# Patient Record
Sex: Female | Born: 1990 | Race: White | Hispanic: Yes | Marital: Single | Smoking: Never smoker
Health system: Southern US, Community
[De-identification: ages and names within clinical notes are randomized; demographics above are authoritative.]

## PROBLEM LIST (undated history)

## (undated) DIAGNOSIS — Z789 Other specified health status: Secondary | ICD-10-CM

## (undated) DIAGNOSIS — D649 Anemia, unspecified: Secondary | ICD-10-CM

## (undated) DIAGNOSIS — N390 Urinary tract infection, site not specified: Secondary | ICD-10-CM

## (undated) HISTORY — PX: OTHER SURGICAL HISTORY: SHX169

## (undated) HISTORY — DX: Other specified health status: Z78.9

## (undated) HISTORY — DX: Urinary tract infection, site not specified: N39.0

## (undated) HISTORY — DX: Anemia, unspecified: D64.9

---

## 2020-05-24 ENCOUNTER — Other Ambulatory Visit: Payer: Self-pay

## 2020-05-24 ENCOUNTER — Ambulatory Visit (LOCAL_COMMUNITY_HEALTH_CENTER): Payer: Self-pay

## 2020-05-24 VITALS — BP 89/64 | Ht 59.06 in | Wt 112.0 lb

## 2020-05-24 DIAGNOSIS — Z3201 Encounter for pregnancy test, result positive: Secondary | ICD-10-CM

## 2020-05-24 LAB — PREGNANCY, URINE: Preg Test, Ur: POSITIVE — AB

## 2020-05-24 MED ORDER — PRENATAL VITAMIN 27-0.8 MG PO TABS: 1.0000 | ORAL_TABLET | Freq: Every day | ORAL | 0 refills | Status: AC

## 2020-05-24 NOTE — Progress Notes (Signed)
No [facility]. Pt desires prenatal care at [facility]; sent to preadmit.

## 2020-06-06 ENCOUNTER — Telehealth: Payer: Self-pay

## 2020-06-06 NOTE — Telephone Encounter (Signed)
LM for patient to arrive at 1:00 tomorrow for new OB appointment. Interpreter, Juliene Pina.Marland KitchenMarland KitchenBurt Knack, RN

## 2020-07-08 ENCOUNTER — Telehealth: Payer: Self-pay

## 2020-07-08 NOTE — Telephone Encounter (Signed)
TC to patient to follow-up on missed new OB appointment. Phone belongs to patient spouse who states they are receiving maternity care at [facility]. Interpreter ID# [id number], Reuel Boom.Burt Knack, RN

## 2020-07-12 ENCOUNTER — Other Ambulatory Visit: Payer: Self-pay | Admitting: Physician Assistant

## 2020-07-12 DIAGNOSIS — Z3482 Encounter for supervision of other normal pregnancy, second trimester: Secondary | ICD-10-CM

## 2020-07-15 ENCOUNTER — Emergency Department
Admission: EM | Admit: 2020-07-15 | Discharge: 2020-07-15 | Disposition: A | Discharge status: Home / Self Care | Payer: Self-pay | Attending: Emergency Medicine | Admitting: Emergency Medicine

## 2020-07-15 ENCOUNTER — Emergency Department: Payer: Self-pay

## 2020-07-15 ENCOUNTER — Other Ambulatory Visit: Payer: Self-pay

## 2020-07-15 ENCOUNTER — Encounter: Payer: Self-pay | Admitting: Emergency Medicine

## 2020-07-15 DIAGNOSIS — Z3A19 19 weeks gestation of pregnancy: Secondary | ICD-10-CM | POA: Insufficient documentation

## 2020-07-15 DIAGNOSIS — M545 Low back pain, unspecified: Secondary | ICD-10-CM | POA: Insufficient documentation

## 2020-07-15 DIAGNOSIS — O26892 Other specified pregnancy related conditions, second trimester: Secondary | ICD-10-CM | POA: Insufficient documentation

## 2020-07-15 DIAGNOSIS — Z3492 Encounter for supervision of normal pregnancy, unspecified, second trimester: Secondary | ICD-10-CM

## 2020-07-15 DIAGNOSIS — R1031 Right lower quadrant pain: Secondary | ICD-10-CM | POA: Insufficient documentation

## 2020-07-15 DIAGNOSIS — R110 Nausea: Secondary | ICD-10-CM | POA: Insufficient documentation

## 2020-07-15 LAB — CBC
HCT: 34 % — ABNORMAL LOW (ref 36.0–46.0)
Hemoglobin: 11.9 g/dL — ABNORMAL LOW (ref 12.0–15.0)
MCH: 31.4 pg (ref 26.0–34.0)
MCHC: 35 g/dL (ref 30.0–36.0)
MCV: 89.7 fL (ref 80.0–100.0)
Platelets: 315 10*3/uL (ref 150–400)
RBC: 3.79 MIL/uL — ABNORMAL LOW (ref 3.87–5.11)
RDW: 13.8 % (ref 11.5–15.5)
WBC: 9 10*3/uL (ref 4.0–10.5)
nRBC: 0 % (ref 0.0–0.2)

## 2020-07-15 LAB — COMPREHENSIVE METABOLIC PANEL
ALT: 10 U/L (ref 0–44)
AST: 20 U/L (ref 15–41)
Albumin: 3.4 g/dL — ABNORMAL LOW (ref 3.5–5.0)
Alkaline Phosphatase: 60 U/L (ref 38–126)
Anion gap: 9 (ref 5–15)
BUN: 11 mg/dL (ref 6–20)
CO2: 23 mmol/L (ref 22–32)
Calcium: 9 mg/dL (ref 8.9–10.3)
Chloride: 104 mmol/L (ref 98–111)
Creatinine, Ser: 0.5 mg/dL (ref 0.44–1.00)
GFR, Estimated: >60 mL/min (ref >60)
Glucose, Bld: 78 mg/dL (ref 70–99)
Potassium: 4.7 mmol/L (ref 3.5–5.1)
Sodium: 136 mmol/L (ref 135–145)
Total Bilirubin: 0.5 mg/dL (ref 0.3–1.2)
Total Protein: 7.2 g/dL (ref 6.5–8.1)

## 2020-07-15 LAB — URINALYSIS, COMPLETE (UACMP) WITH MICROSCOPIC
Bilirubin Urine: NEGATIVE
Glucose, UA: NEGATIVE mg/dL
Hgb urine dipstick: NEGATIVE
Ketones, ur: 5 mg/dL — AB
Leukocytes,Ua: NEGATIVE
Nitrite: NEGATIVE
Protein, ur: NEGATIVE mg/dL
Specific Gravity, Urine: 1.008 (ref 1.005–1.030)
pH: 6 (ref 5.0–8.0)

## 2020-07-15 LAB — LIPASE, BLOOD: Lipase: 36 U/L (ref 11–51)

## 2020-07-15 MED ORDER — MORPHINE SULFATE (PF) 2 MG/ML IV SOLN
2.0000 mg | Freq: Once | INTRAVENOUS | Status: AC
  Administered 2020-07-15: 2 mg via INTRAVENOUS
  Filled 2020-07-15: qty 1

## 2020-07-15 MED ORDER — SODIUM CHLORIDE 0.9 % IV BOLUS
1000.0000 mL | Freq: Once | INTRAVENOUS | Status: AC
  Administered 2020-07-15: 1000 mL via INTRAVENOUS

## 2020-07-15 MED ORDER — ONDANSETRON HCL 4 MG/2ML IJ SOLN
4.0000 mg | Freq: Once | INTRAMUSCULAR | Status: AC
  Administered 2020-07-15: 4 mg via INTRAVENOUS
  Filled 2020-07-15: qty 2

## 2020-07-15 NOTE — Discharge Instructions (Signed)
Your ultrasound of the kidneys and pregnancy were okay, and an MRI of your abdomen and pelvis was also normal.  We did not find any specific cause for your symptoms, but your evaluation is reassuring.  Take Tylenol as needed and follow-up with your doctor this week please.

## 2020-07-15 NOTE — ED Triage Notes (Signed)
Pt c/o right sided abdominal pain and right flank pain.  Pt denies urinary symptoms.  Pt describes pain as constant cramping.  Pt denies n/v.  FHT obtained in triage 125 with fetal movement

## 2020-07-15 NOTE — ED Notes (Signed)
Rainbow sent to the lab.  

## 2020-07-15 NOTE — ED Notes (Signed)
Ultrasound at bedside at this time.

## 2020-07-15 NOTE — ED Provider Notes (Signed)
[facility] Emergency Department Provider Note  ____________________________________________  Time seen: Approximately 6:37 PM  I have reviewed the triage vital signs and the nursing notes.   HISTORY  Chief Complaint Abdominal Pain  Encounter completed with Spanish video interpreter  HPI Kelly Gutierrez is a 29 y.o. female is currently 19 weeks 1 day pregnant, [facility], who was in her usual state of health until last night when she began having right lower back pain radiating to the right lower quadrant which is intermittent, feels sharp and crampy, associated with nausea.  No vomiting or diarrhea.  Pain currently 7/10 in intensity.  No aggravating or alleviating factors.  Denies fever or chills.  No vaginal bleeding, discharge, or fluid leakage.  Fetal heart tones in triage were 125.      Past Medical History:  Diagnosis Date  . Anemia    during 1st pregnancy  . Patient denies medical problems   . UTI (urinary tract infection)    during previous pregnancy     There are no problems to display for this patient.    Past Surgical History:  Procedure Laterality Date  . denies    . denies surgical history       Prior to Admission medications   Medication Sig Start Date End Date Taking? Authorizing Provider  Prenatal Vit-Fe Fumarate-FA (PRENATAL VITAMIN) 27-0.8 MG TABS Take 1 tablet by mouth daily. 05/24/20 09/01/20 Yes Federico FlakeNewton, Kimberly Niles, MD     Allergies Patient has no known allergies.   History reviewed. No pertinent family history.  Social History Social History   Tobacco Use  . Smoking status: Never Smoker  . Smokeless tobacco: Never Used  Vaping Use  . Vaping Use: Never used  Substance Use Topics  . Alcohol use: Never  . Drug use: Never    Review of Systems  Constitutional:   No fever or chills.  ENT:   No sore throat. No rhinorrhea. Cardiovascular:   No chest pain or syncope. Respiratory:   No  dyspnea or cough. Gastrointestinal:   Positive as above for abdominal pain without vomiting or diarrhea Musculoskeletal:   Negative for focal pain or swelling All other systems reviewed and are negative except as documented above in ROS and HPI.  ____________________________________________   PHYSICAL EXAM:  VITAL SIGNS: ED Triage Vitals  Enc Vitals Group     BP 07/15/20 1355 (!) 106/58     Pulse Rate 07/15/20 1355 69     Resp 07/15/20 1355 16     Temp 07/15/20 1355 98.1 F (36.7 C)     Temp Source 07/15/20 1355 Oral     SpO2 07/15/20 1355 100 %     Weight 07/15/20 1401 116 lb (52.6 kg)     Height 07/15/20 1401 4' 11.06" (1.5 m)     Head Circumference --      Peak Flow --      Pain Score 07/15/20 1407 7     Pain Loc --      Pain Edu? --      Excl. in GC? --     Vital signs reviewed, nursing assessments reviewed.   Constitutional:   Alert and oriented. Non-toxic appearance. Eyes:   Conjunctivae are normal. EOMI. PERRL. ENT      Head:   Normocephalic and atraumatic.      Nose:   Wearing a mask.      Mouth/Throat:   Wearing a mask.      Neck:  No meningismus. Full ROM. Hematological/Lymphatic/Immunilogical:   No cervical lymphadenopathy. Cardiovascular:   RRR.  Normal radial pulse.  Cap refill less than 2 seconds. Respiratory:   Normal respiratory effort without tachypnea/retractions. Gastrointestinal:   Soft with right lower quadrant tenderness.  Gravid abdomen, size consistent with dates.  Non distended. There is no CVA tenderness.  No rebound, rigidity, or guarding.  Musculoskeletal:   Normal range of motion in all extremities. No joint effusions.  No lower extremity tenderness.  No edema. Neurologic:   Normal speech and language.  Motor grossly intact. No acute focal neurologic deficits are appreciated.  Skin:    Skin is warm, dry and intact. No rash noted.  No petechiae, purpura, or bullae.  ____________________________________________    LABS (pertinent  positives/negatives) (all labs ordered are listed, but only abnormal results are displayed) Labs Reviewed  COMPREHENSIVE METABOLIC PANEL - Abnormal; Notable for the following components:      Result Value   Albumin 3.4 (*)    All other components within normal limits  CBC - Abnormal; Notable for the following components:   RBC 3.79 (*)    Hemoglobin 11.9 (*)    HCT 34.0 (*)    All other components within normal limits  URINALYSIS, COMPLETE (UACMP) WITH MICROSCOPIC - Abnormal; Notable for the following components:   Color, Urine STRAW (*)    APPearance CLEAR (*)    Ketones, ur 5 (*)    Bacteria, UA RARE (*)    All other components within normal limits  LIPASE, BLOOD   ____________________________________________   EKG    ____________________________________________    RADIOLOGY  MR PELVIS WO CONTRAST  Result Date: 07/15/2020 CLINICAL DATA:  Right-sided abdominal pain, known pregnancy EXAM: MRI ABDOMEN AND PELVIS WITHOUT CONTRAST TECHNIQUE: Multiplanar multisequence MR imaging of the abdomen and pelvis was performed. No intravenous contrast was administered. COMPARISON:  Ultrasounds from earlier in the same day. FINDINGS: COMBINED FINDINGS FOR BOTH MR ABDOMEN AND PELVIS Lower chest: Within normal limits. Hepatobiliary: Liver and gallbladder appear within normal limits. No biliary ductal dilatation is seen. Pancreas: No mass, inflammatory changes, or other parenchymal abnormality identified. Spleen:  Within normal limits in size and appearance. Adrenals/Urinary Tract: Adrenal glands are unremarkable. Kidneys demonstrate no mass lesion or hydronephrosis. Bladder is well distended. No filling defect is identified. Stomach/Bowel: Colon shows no obstructive or inflammatory changes. Small bowel is within normal limits. Stomach is decompressed. The appendix is well visualized in the right lower quadrant adjacent to the cecum. No inflammatory changes are seen. No findings are noted to  suggest acute appendicitis. Vascular/Lymphatic:  Within normal limits. Reproductive: Gravid uterus is noted. The placenta is posterior in location. Current fetal position is breech similar to that noted on prior ultrasound. No evidence of placenta previa is seen. Adnexa appear within normal limits. Other:  None Musculoskeletal: No suspicious bone lesions identified. IMPRESSION: Normal-appearing appendix in the right lower quadrant. No findings to suggest appendicitis are noted. Single intrauterine gestation as previously described. No other focal abnormality is seen. Electronically Signed   By: Alcide Clever M.D.   On: 07/15/2020 21:37   MR ABDOMEN WO CONTRAST  Result Date: 07/15/2020 CLINICAL DATA:  Right-sided abdominal pain, known pregnancy EXAM: MRI ABDOMEN AND PELVIS WITHOUT CONTRAST TECHNIQUE: Multiplanar multisequence MR imaging of the abdomen and pelvis was performed. No intravenous contrast was administered. COMPARISON:  Ultrasounds from earlier in the same day. FINDINGS: COMBINED FINDINGS FOR BOTH MR ABDOMEN AND PELVIS Lower chest: Within normal limits. Hepatobiliary: Liver and gallbladder  appear within normal limits. No biliary ductal dilatation is seen. Pancreas: No mass, inflammatory changes, or other parenchymal abnormality identified. Spleen:  Within normal limits in size and appearance. Adrenals/Urinary Tract: Adrenal glands are unremarkable. Kidneys demonstrate no mass lesion or hydronephrosis. Bladder is well distended. No filling defect is identified. Stomach/Bowel: Colon shows no obstructive or inflammatory changes. Small bowel is within normal limits. Stomach is decompressed. The appendix is well visualized in the right lower quadrant adjacent to the cecum. No inflammatory changes are seen. No findings are noted to suggest acute appendicitis. Vascular/Lymphatic:  Within normal limits. Reproductive: Gravid uterus is noted. The placenta is posterior in location. Current fetal position is  breech similar to that noted on prior ultrasound. No evidence of placenta previa is seen. Adnexa appear within normal limits. Other:  None Musculoskeletal: No suspicious bone lesions identified. IMPRESSION: Normal-appearing appendix in the right lower quadrant. No findings to suggest appendicitis are noted. Single intrauterine gestation as previously described. No other focal abnormality is seen. Electronically Signed   By: Alcide Clever M.D.   On: 07/15/2020 21:37   US OB Limited > 14 wks  Result Date: 07/15/2020 CLINICAL DATA:  Nineteen weeks pregnant, right lower quadrant pain EXAM: LIMITED OBSTETRIC ULTRASOUND AN DOPPLER FINDINGS: Number of Fetuses: 1 Heart Rate:  147 bpm Movement: Yes Presentation: Breech Placental Location: Posterior Previa: No Amniotic Fluid (Subjective):  Within normal limits. BPD:  5.5cm 22w 4d MATERNAL FINDINGS: Cervix:  Appears closed. Uterus/Adnexae: No abnormality visualized. Right ovary measures 1.7 x 1.1 x 1.2 cm. Left ovary measures 2 x 1 x 1.6 cm. Pulsed Doppler evaluation of both ovaries demonstrates normal low-resistance arterial and venous waveforms. Other findings No free fluid. IMPRESSION: 1. Negative for ovarian torsion or ovarian mass lesion. 2. Single viable intrauterine pregnancy as above. No specific abnormality is seen. This exam is performed on an emergent basis and does not comprehensively evaluate fetal size, dating, or anatomy; follow-up complete OB US should be considered if further fetal assessment is warranted. Electronically Signed   By: Jasmine Pang M.D.   On: 07/15/2020 19:15   US Renal  Result Date: 07/15/2020 CLINICAL DATA:  Right low back pain radiating to right lower quadrant. Nineteen weeks pregnant. EXAM: RENAL / URINARY TRACT ULTRASOUND COMPLETE COMPARISON:  None. FINDINGS: Right Kidney: Renal measurements: 9.6 x 4.6 x 4.8 cm = volume: 112 mL. Echogenicity within normal limits. No mass or hydronephrosis visualized. Left Kidney: Renal  measurements: 9.2 x 5.2 x 5.0 cm = volume: 12.4 mL. Echogenicity within normal limits. No mass or hydronephrosis visualized. Bladder: Appears normal for degree of bladder distention. Other: None. IMPRESSION: No acute findings.  No hydronephrosis. Electronically Signed   By: Charlett Nose M.D.   On: 07/15/2020 19:16   US PELVIC DOPPLER (TORSION R/O OR MASS ARTERIAL FLOW)  Result Date: 07/15/2020 CLINICAL DATA:  Nineteen weeks pregnant, right lower quadrant pain EXAM: LIMITED OBSTETRIC ULTRASOUND AN DOPPLER FINDINGS: Number of Fetuses: 1 Heart Rate:  147 bpm Movement: Yes Presentation: Breech Placental Location: Posterior Previa: No Amniotic Fluid (Subjective):  Within normal limits. BPD:  5.5cm 22w 4d MATERNAL FINDINGS: Cervix:  Appears closed. Uterus/Adnexae: No abnormality visualized. Right ovary measures 1.7 x 1.1 x 1.2 cm. Left ovary measures 2 x 1 x 1.6 cm. Pulsed Doppler evaluation of both ovaries demonstrates normal low-resistance arterial and venous waveforms. Other findings No free fluid. IMPRESSION: 1. Negative for ovarian torsion or ovarian mass lesion. 2. Single viable intrauterine pregnancy as above. No specific abnormality is seen.  This exam is performed on an emergent basis and does not comprehensively evaluate fetal size, dating, or anatomy; follow-up complete OB US should be considered if further fetal assessment is warranted. Electronically Signed   By: Jasmine Pang M.D.   On: 07/15/2020 19:15    ____________________________________________   PROCEDURES Procedures  ____________________________________________  DIFFERENTIAL DIAGNOSIS   Ureterolithiasis, appendicitis, ovarian cyst, ovarian torsion  CLINICAL IMPRESSION / ASSESSMENT AND PLAN / ED COURSE  Medications ordered in the ED: Medications  sodium chloride 0.9 % bolus 1,000 mL (0 mLs Intravenous Stopped 07/15/20 2059)  ondansetron (ZOFRAN) injection 4 mg (4 mg Intravenous Given 07/15/20 1850)  morphine 2 MG/ML injection  2 mg (2 mg Intravenous Given 07/15/20 1850)    Pertinent labs & imaging results that were available during my care of the patient were reviewed by me and considered in my medical decision making (see chart for details).  Kelly Gutierrez was evaluated in Emergency Department on 07/15/2020 for the symptoms described in the history of present illness. She was evaluated in the context of the global COVID-19 pandemic, which necessitated consideration that the patient might be at risk for infection with the SARS-CoV-2 virus that causes COVID-19. Institutional protocols and algorithms that pertain to the evaluation of patients at risk for COVID-19 are in a state of rapid change based on information released by regulatory bodies including the CDC and federal and state organizations. These policies and algorithms were followed during the patient's care in the ED.   Patient presents with right lower quadrant pain.  Doubt STI PID or TOA.  Afebrile with normal vitals.  Nontoxic appearance.  We will start with ultrasound renal and OB protocols.  Proceed to MRI abdomen pelvis to evaluate for appendicitis if ultrasounds are unremarkable.  Will give initial IV fluids, Zofran 4 mg IV for nausea control and morphine 4 mg IV for pain control while proceeding with work-up.  Clinical Course as of Jul 15 2256  Mon Jul 15, 2020  1931 Ultrasound renal and pelvic both unremarkable.  Live IUP without ovarian mass or torsion, no renal abnormalities.  Awaiting MRI.   [PS]    Clinical Course User Index [PS] Sharman Cheek, MD     ----------------------------------------- 10:57 PM on 07/15/2020 -----------------------------------------  MRI is normal.  Vital signs remain normal.  Patient reports that her pain is improved and she is now nontender on repeat abdominal exam.  Stable for discharge home to follow-up with her PCP this week.  ____________________________________________   FINAL CLINICAL  IMPRESSION(S) / ED DIAGNOSES    Final diagnoses:  Right lower quadrant abdominal pain  Second trimester pregnancy     ED Discharge Orders    None      Portions of this note were generated with dragon dictation software. Dictation errors may occur despite best attempts at proofreading.   Sharman Cheek, MD 07/15/20 2258

## 2020-07-15 NOTE — ED Triage Notes (Signed)
Patient is 19 weeks 1 day.  To be seen through ED.

## 2020-07-26 ENCOUNTER — Ambulatory Visit
Admission: RE | Admit: 2020-07-26 | Discharge: 2020-07-26 | Disposition: A | Discharge status: Home / Self Care | Payer: Self-pay | Source: Ambulatory Visit | Attending: Physician Assistant | Admitting: Physician Assistant

## 2020-07-26 ENCOUNTER — Other Ambulatory Visit: Payer: Self-pay

## 2020-07-26 DIAGNOSIS — Z3482 Encounter for supervision of other normal pregnancy, second trimester: Secondary | ICD-10-CM | POA: Insufficient documentation

## 2022-01-19 IMAGING — US US RENAL
1 series · 14 of 23 positions shown · non-contrast
Comparison: None.

CLINICAL DATA: Right low back pain radiating to right lower
quadrant. Nineteen weeks pregnant.

EXAM:
RENAL / URINARY TRACT ULTRASOUND COMPLETE

[Series 1: us renal · 14 of 23 slices shown]
[im 1/23]
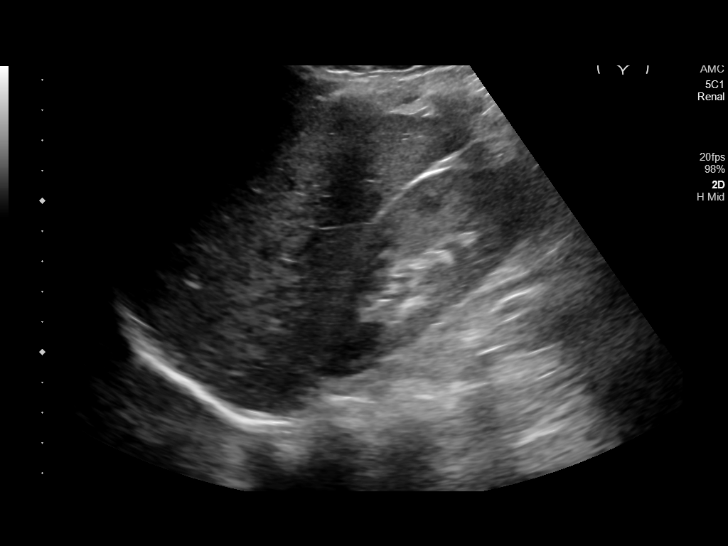
[im 3/23]
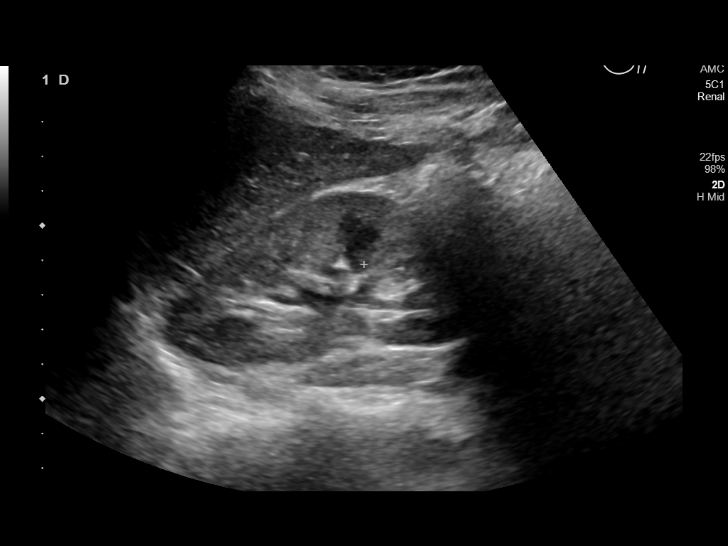
[im 5/23]
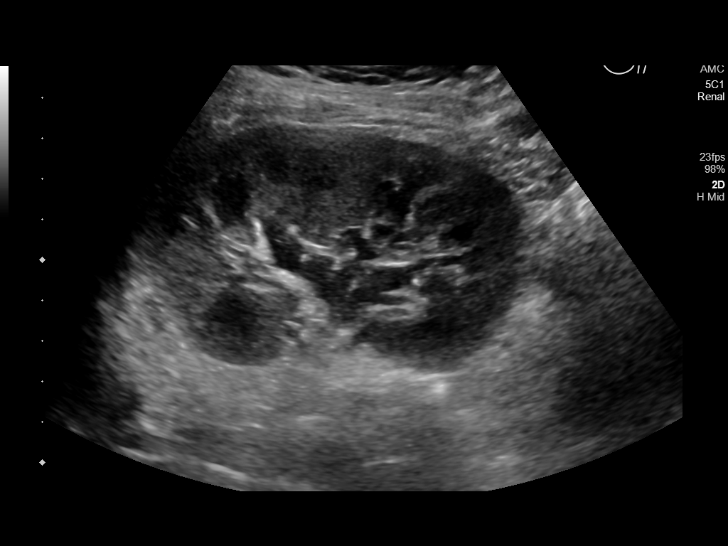
[im 6/23]
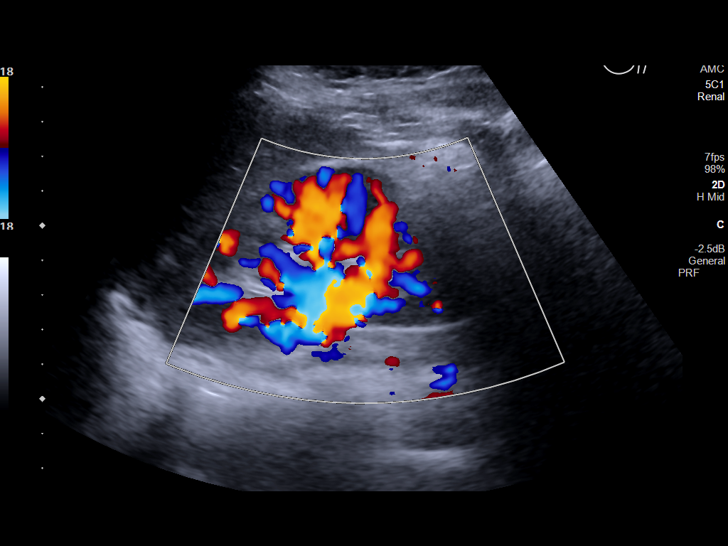
[im 8/23]
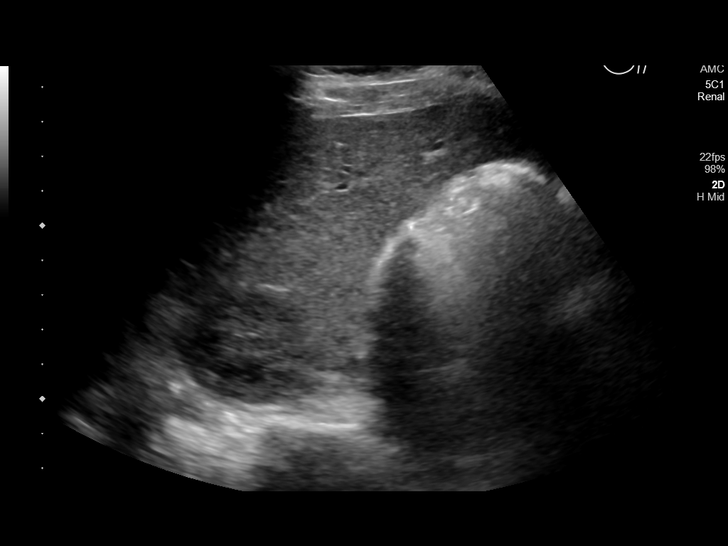
[im 10/23]
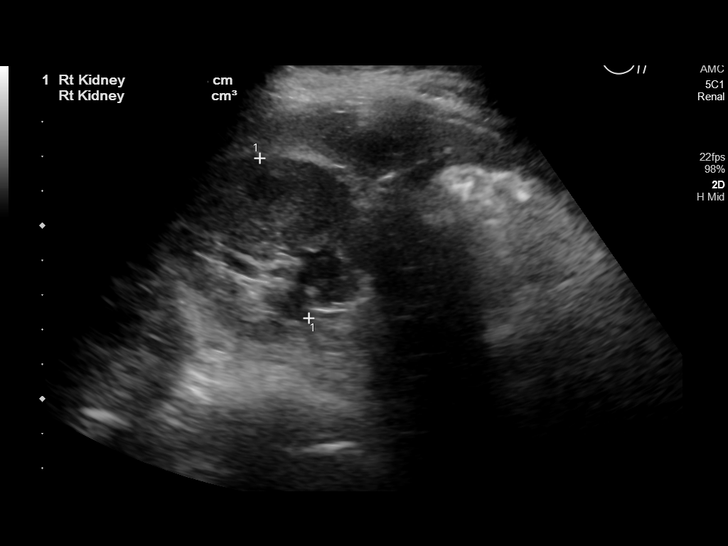
[im 11/23]
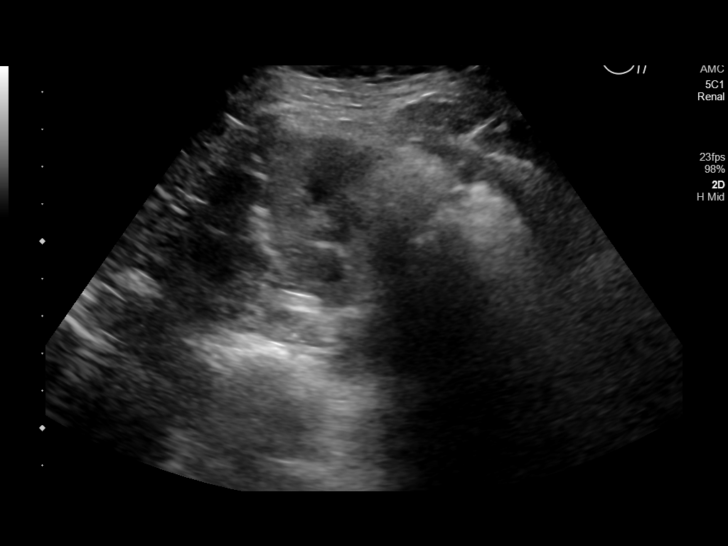
[im 13/23]
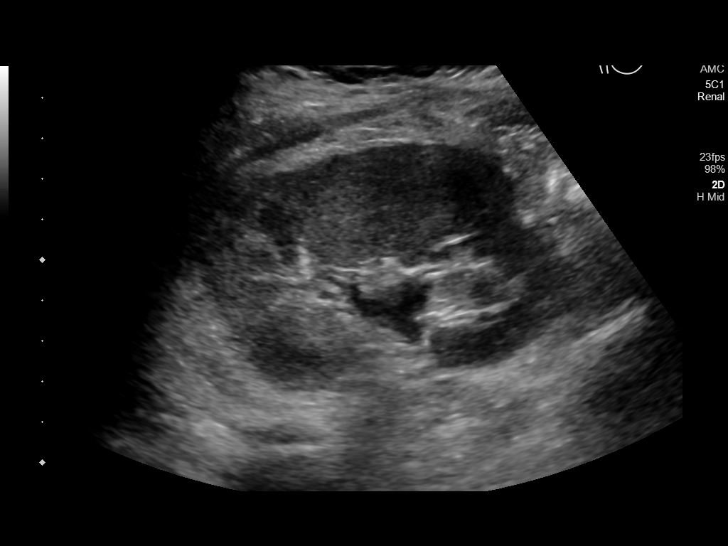
[im 14/23]
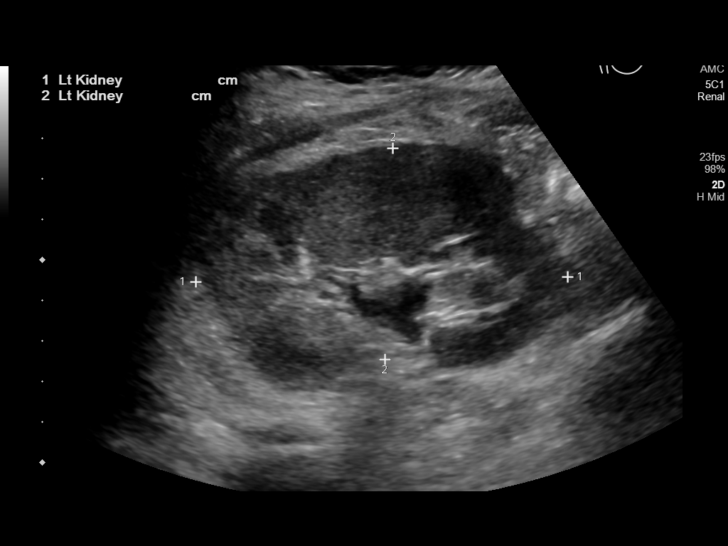
[im 16/23]
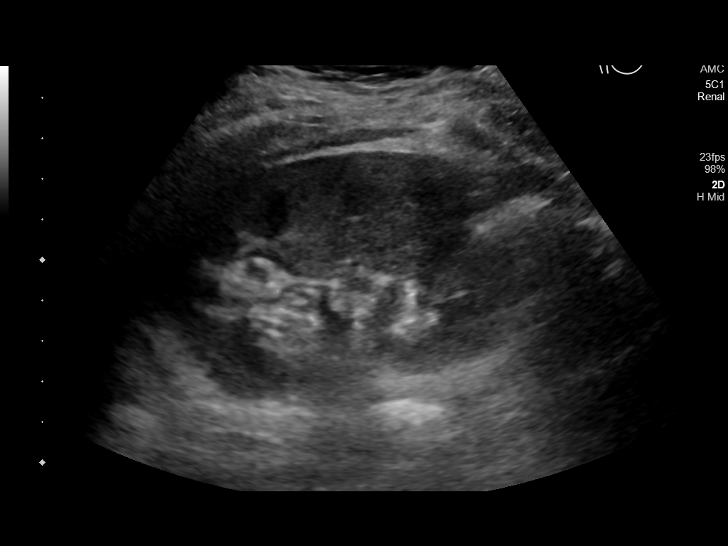
[im 18/23]
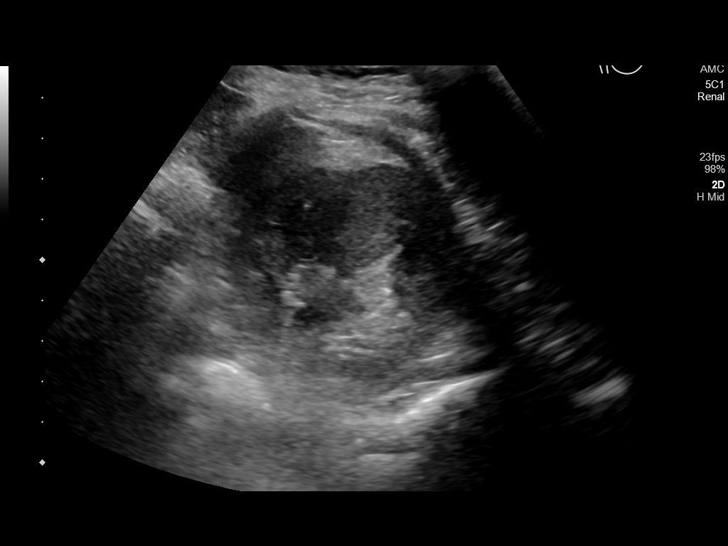
[im 19/23]
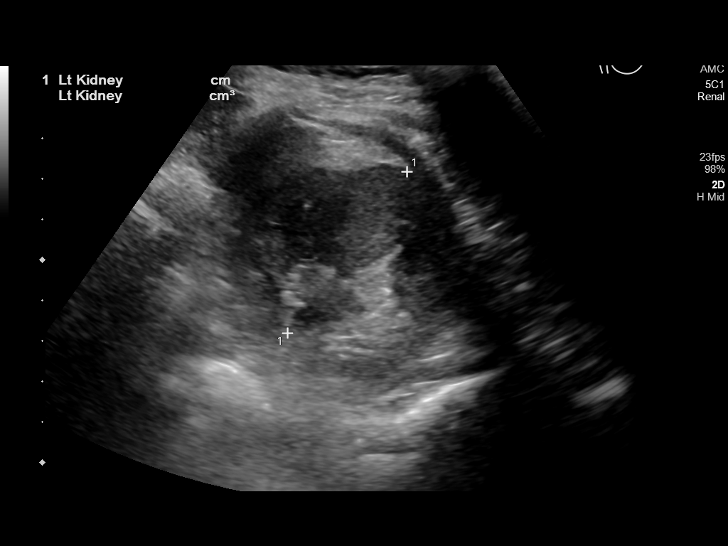
[im 21/23]
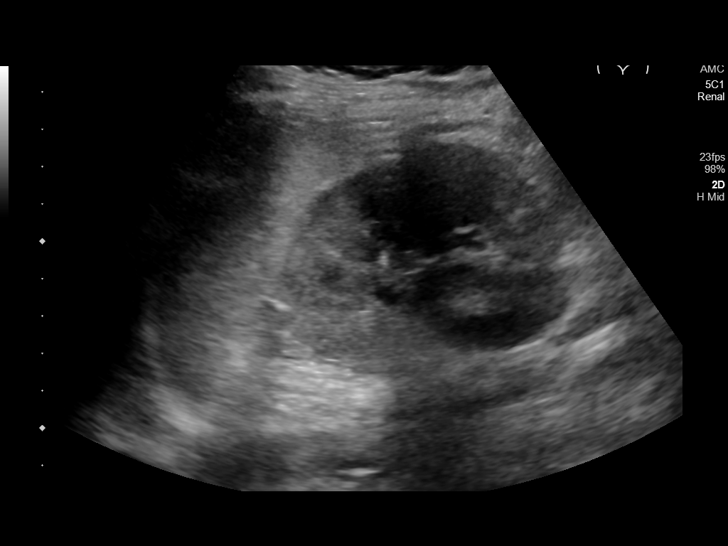
[im 23/23]
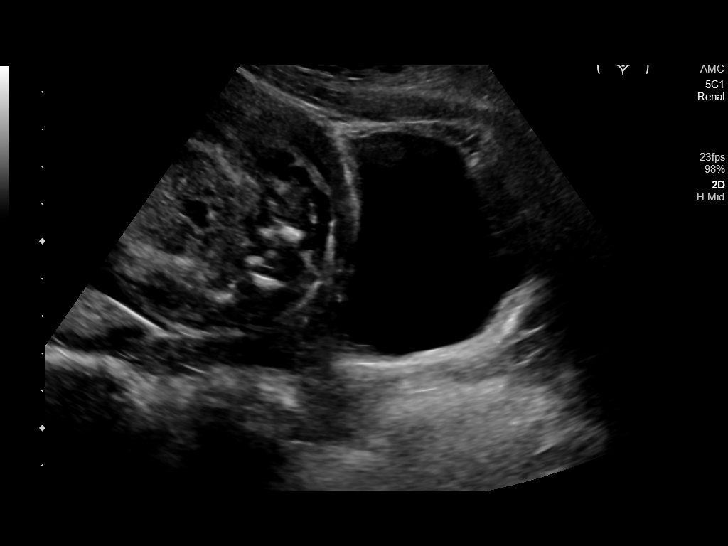

[14 of 23 positions shown; findings below may reference images not displayed]

FINDINGS: Right Kidney:

Renal measurements: 9.6 x 4.6 x 4.8 cm = volume: 112 mL.
Echogenicity within normal limits. No mass or hydronephrosis
visualized.

Left Kidney:

Renal measurements: 9.2 x 5.2 x 5.0 cm = volume: 12.4 mL.
Echogenicity within normal limits. No mass or hydronephrosis
visualized.

Bladder:

Appears normal for degree of bladder distention.

Other:

None.
IMPRESSION: No acute findings.  No hydronephrosis.
